# Patient Record
Sex: Male | Born: 1990 | Race: White | Hispanic: No | Marital: Single | Smoking: Never smoker
Health system: Southern US, Community
[De-identification: ages and names within clinical notes are randomized; demographics above are authoritative.]

## PROBLEM LIST (undated history)

## (undated) HISTORY — PX: APPENDECTOMY: SHX54

---
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
## 2008-11-17 ENCOUNTER — Inpatient Hospital Stay (HOSPITAL_COMMUNITY): Admission: EM | Admit: 2008-11-17 | Discharge: 2008-11-17 | Payer: Self-pay | Admitting: Emergency Medicine

## 2008-11-17 ENCOUNTER — Encounter (INDEPENDENT_AMBULATORY_CARE_PROVIDER_SITE_OTHER): Payer: Self-pay | Admitting: General Surgery

## 2010-07-01 IMAGING — CT CT ABDOMEN W/ CM
2 of 4 series · 15 of 46 positions shown, 17 images · IV contrast (Omnipaque 300)
Comparison: No priors.

CT ABDOMEN

CLINICAL DATA: Severe abdominal pain.  Intermittent right lower
quadrant pain for 2 weeks.

CT ABDOMEN AND PELVIS WITH CONTRAST
TECHNIQUE: Multidetector CT imaging of the abdomen and pelvis was
performed using the standard protocol following bolus
administration of intravenous contrast.
Contrast: 100 ml 5mnipaque-ERR IV and oral contrast media.

[Series 2: abd_pel_with 5.0 b40f · axial · 0.62mm/px · z∈[-430,-44]mm · 12 of 89 slices shown, 14 images]
[im 8/89  soft-tissue]
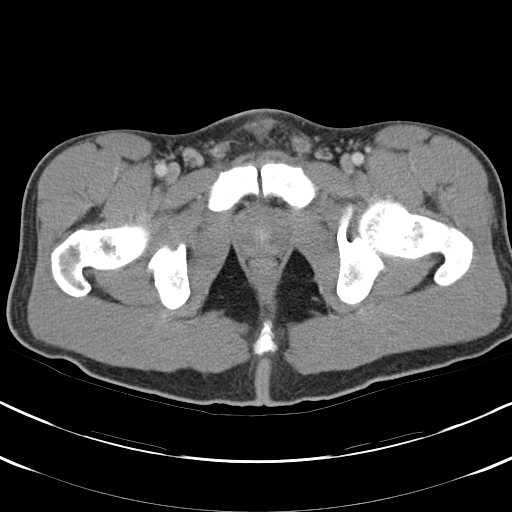
[im 8/89  bone]
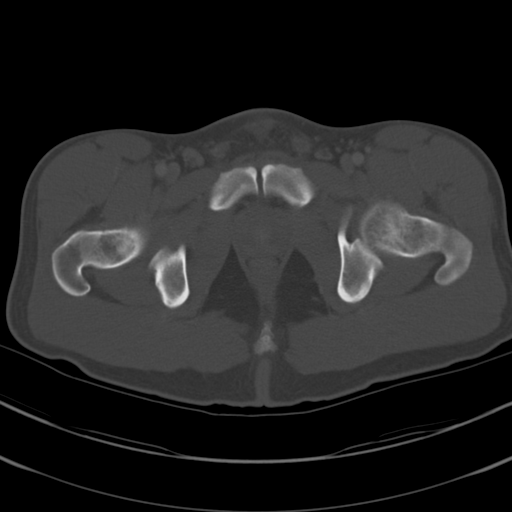
[im 15/89  soft-tissue]
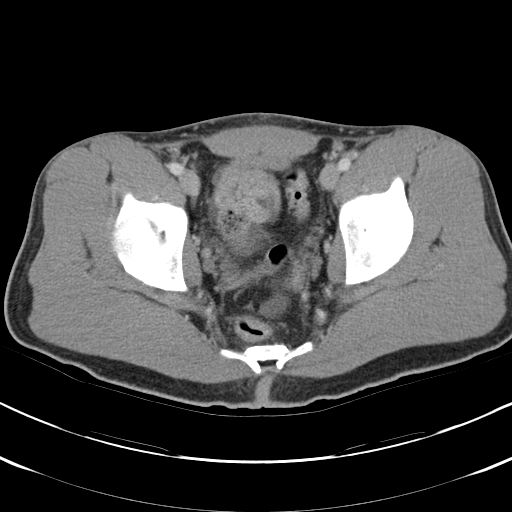
[im 22/89  soft-tissue]
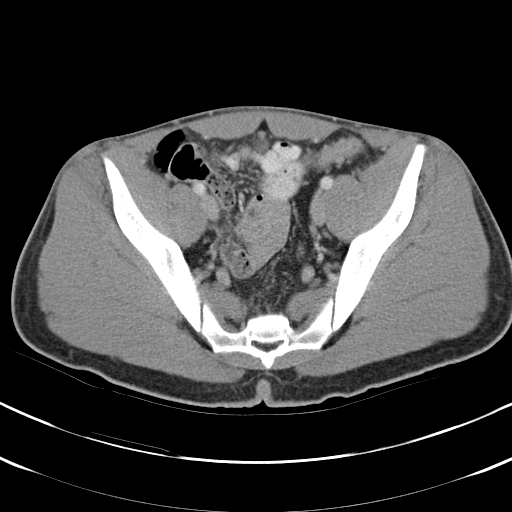
[im 29/89  soft-tissue]
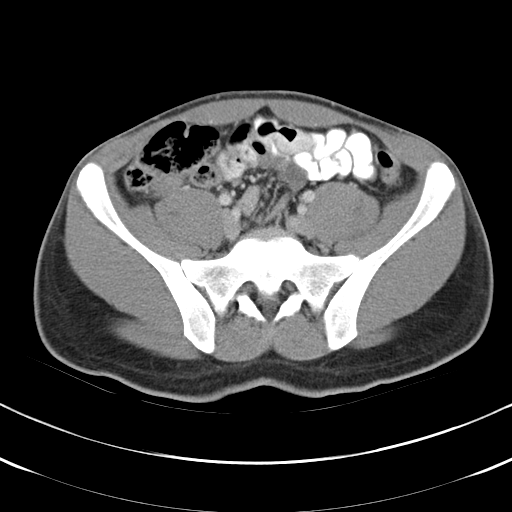
[im 36/89  soft-tissue]
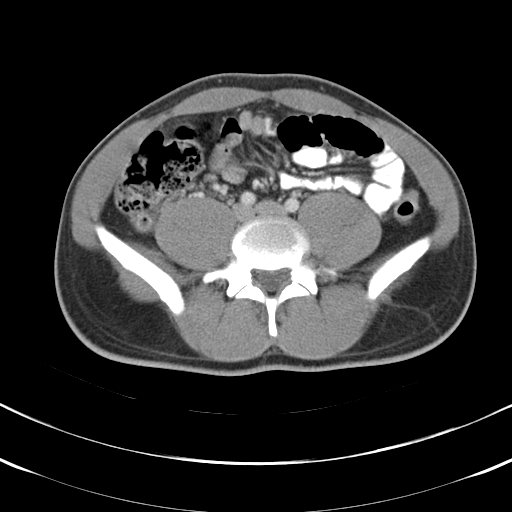
[im 43/89  soft-tissue]
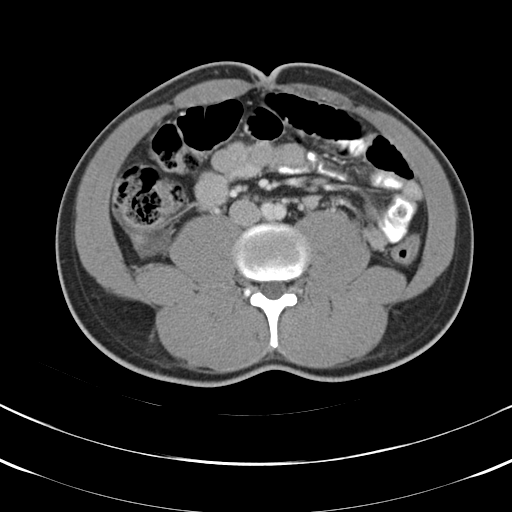
[im 50/89  soft-tissue]
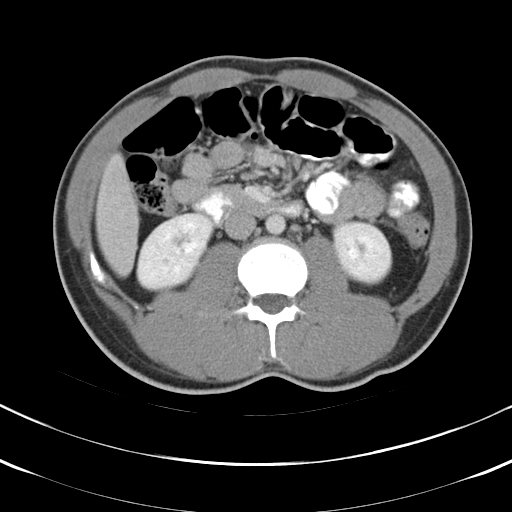
[im 57/89  soft-tissue]
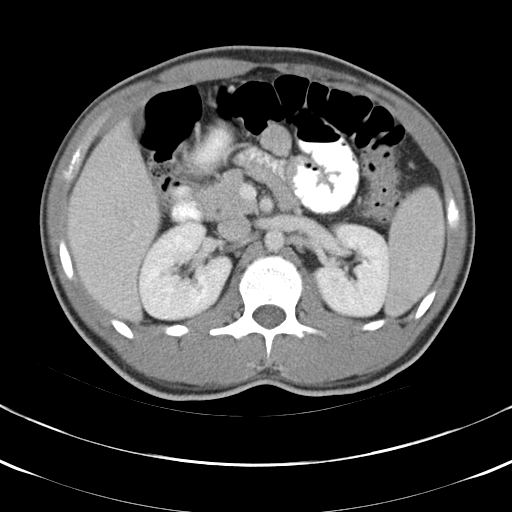
[im 64/89  soft-tissue]
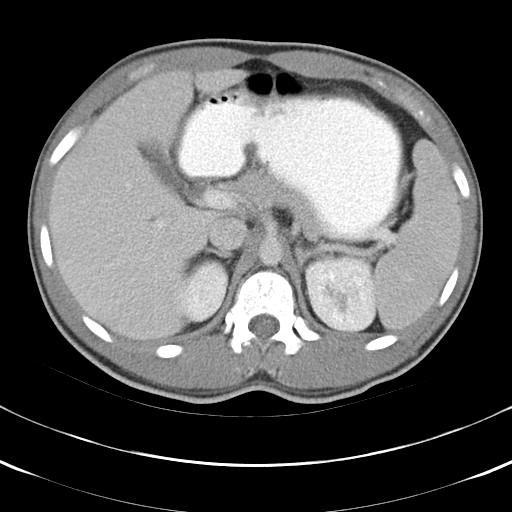
[im 64/89  bone]
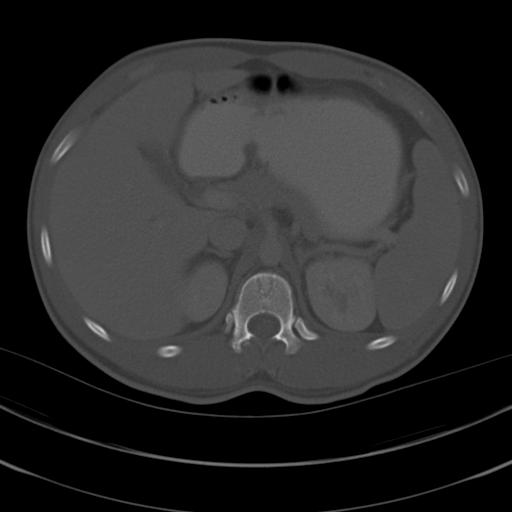
[im 71/89  soft-tissue]
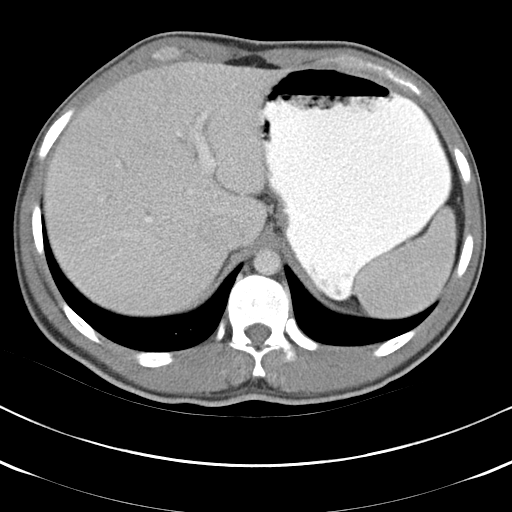
[im 78/89  soft-tissue]
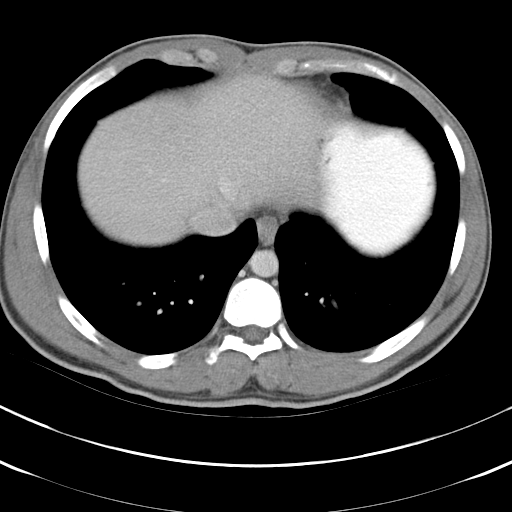
[im 85/89  soft-tissue]
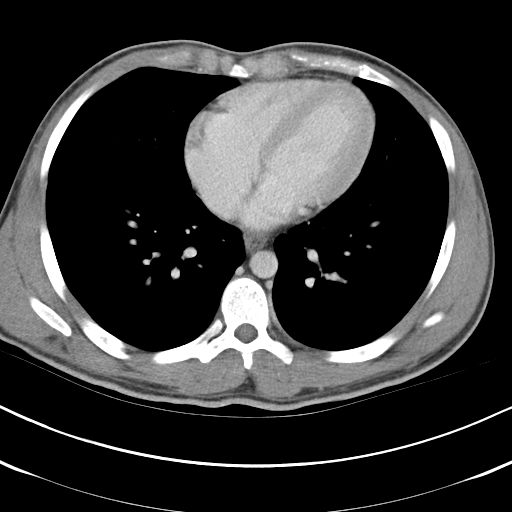

[Series 4: mpr cor post contrast (id) · coronal · 0.54mm/px · 3 of 72 slices shown]
[im 24/72  soft-tissue]
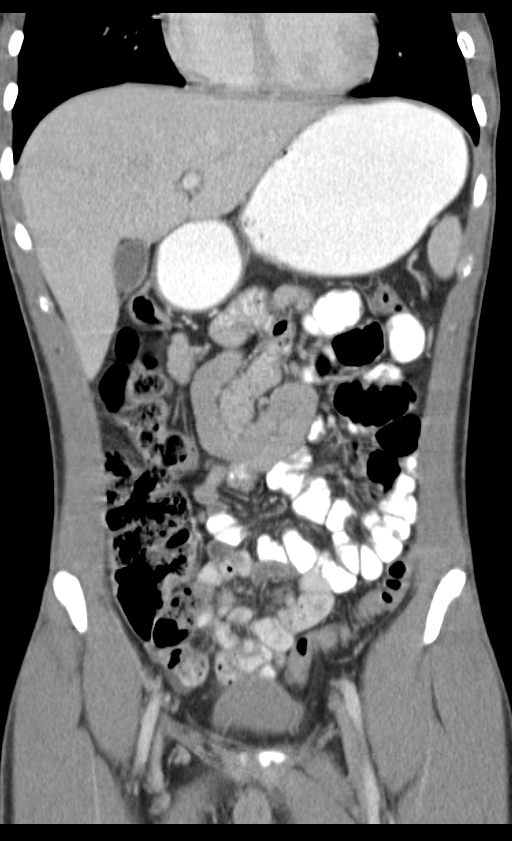
[im 32/72  soft-tissue]
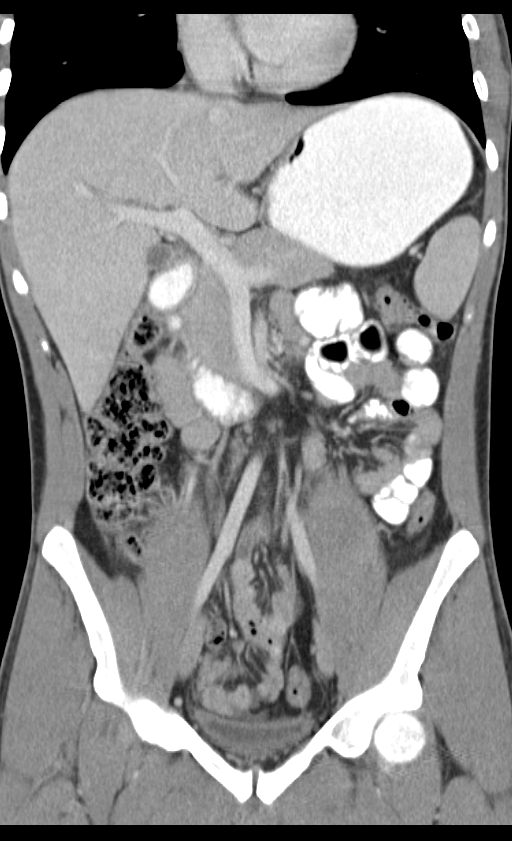
[im 40/72  soft-tissue]
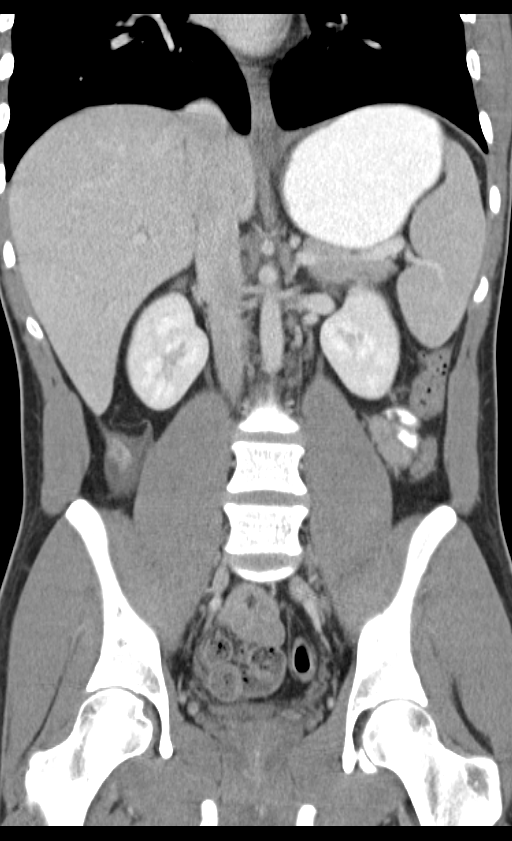

[15 of 46 positions shown; findings below may reference images not displayed]

FINDINGS: Normal liver, spleen, pancreas, kidneys, and adrenal
glands.  The appendix is dilated measuring 9 mm in diameter.  The
appendix wall is thickened.  There is a small amount of free fluid
surrounding the appendix.  Findings are consistent with acute
appendicitis.
IMPRESSION: CT findings consistent with appendicitis.

CT PELVIS
FINDINGS: No free pelvic fluid.  No primary acute pelvic
abnormality.  Findings consistent with acute appendicitis are again
noted.
IMPRESSION: CT findings consistent with acute appendicitis. Critical test
results telephoned to Dr.Yingying at the time of interpretation on
11/18/2007 at 9269 hours.

## 2010-11-30 LAB — URINALYSIS, ROUTINE W REFLEX MICROSCOPIC
Glucose, UA: NEGATIVE mg/dL
Ketones, ur: NEGATIVE mg/dL
pH: 6 (ref 5.0–8.0)

## 2010-11-30 LAB — COMPREHENSIVE METABOLIC PANEL
Alkaline Phosphatase: 28 U/L — ABNORMAL LOW (ref 39–117)
BUN: 9 mg/dL (ref 6–23)
CO2: 28 mEq/L (ref 19–32)
Chloride: 105 mEq/L (ref 96–112)
Glucose, Bld: 123 mg/dL — ABNORMAL HIGH (ref 70–99)
Potassium: 3.3 mEq/L — ABNORMAL LOW (ref 3.5–5.1)
Total Bilirubin: 0.7 mg/dL (ref 0.3–1.2)

## 2010-11-30 LAB — CBC
HCT: 43 % (ref 39.0–52.0)
Hemoglobin: 15 g/dL (ref 13.0–17.0)
RBC: 4.74 MIL/uL (ref 4.22–5.81)
WBC: 16 10*3/uL — ABNORMAL HIGH (ref 4.0–10.5)

## 2010-11-30 LAB — DIFFERENTIAL
Basophils Absolute: 0 10*3/uL (ref 0.0–0.1)
Basophils Relative: 0 % (ref 0–1)
Eosinophils Absolute: 0.2 10*3/uL (ref 0.0–0.7)
Neutro Abs: 11.8 10*3/uL — ABNORMAL HIGH (ref 1.7–7.7)
Neutrophils Relative %: 74 % (ref 43–77)

## 2011-01-02 NOTE — Op Note (Signed)
NAME:  Marcus Koch, Marcus Koch NO.:  [id number]   MEDICAL RECORD NO.:  [id number]          PATIENT TYPE:  INP   LOCATION:  A321                          FACILITY:  [facility]   PHYSICIAN:  Dalia Heading, M.D.  DATE OF BIRTH:  05-17-1991   DATE OF PROCEDURE:  11/17/2008  DATE OF DISCHARGE:  11/17/2008                               OPERATIVE REPORT   PREOPERATIVE DIAGNOSIS:  Acute appendicitis.   POSTOPERATIVE DIAGNOSIS:  Acute appendicitis.   PROCEDURE:  Laparoscopic appendectomy.   SURGEON:  Dalia Heading, MD   ANESTHESIA:  General endotracheal.   INDICATIONS:  The patient is a 20 year old white male presented to the  emergency room with worsening right lower quadrant abdominal pain.  CT  scan of the abdomen and pelvis reveals acute appendicitis without  evidence of perforation.  The risks and benefits of the procedure  including bleeding, infection, and the possibility of an open procedure  were fully explained to the patient, gave informed consent.   PROCEDURE NOTE:  The patient was placed in the supine position.  After  induction of general endotracheal anesthesia, the abdomen was prepped  and draped using the usual sterile technique with Betadine.  Surgical  site confirmation was performed.   A supraumbilical incision was made down to the fascia.  A Veress needle  was introduced into the abdominal cavity and confirmation of placement  was done using the saline drop test.  The abdomen was then insufflated  to 16 mmHg pressure.  An 11-mm trocar was introduced into the abdominal  cavity under direct visualization without difficulty.  The patient was  placed in deeper Trendelenburg position.  An additional 12-mm trocar was  placed in the suprapubic region and a 5-mm trocar was placed in the left  lower quadrant region.  The appendix was visualized and noted to be  retrocecal in nature and edematous and thickened.  There was no evidence  of perforation.  The  mesoappendix was divided using the Harmonic  Scalpel.  A standard Endo GIA was placed across the base of the appendix  and fired.  The appendix was removed using Endocatch bag without  difficulty.  The right lower quadrant was then inspected.  All fluid was  evacuated from the right lower quadrant.  A small piece of Surgicel was  placed into this region due to the retrocecal nature of the appendix and  the dissection.  The staple line was inspected and noted to be within  normal limits.  All fluid and air were then evacuated from the abdominal  cavity prior to removal of the trocars.   All wounds were irrigated with normal saline.  All wounds were injected  with 0.5% Sensorcaine.  The supraumbilical fascia as well as suprapubic  fascia reapproximated using 0 Vicryl interrupted sutures.  All skin  incisions were closed using staples.  Betadine ointment and dry sterile  dressings were applied.   All tape and needle counts were correct at the end of the procedure.  The patient was extubated in the operating room and went back to  recovery room  awake in stable condition.   COMPLICATIONS:  None.   SPECIMENS:  Appendix.   BLOOD LOSS:  Minimal.      Dalia Heading, M.D.  Electronically Signed     MAJ/MEDQ  D:  11/17/2008  T:  11/17/2008  Job:  [id number]

## 2015-12-21 ENCOUNTER — Encounter (HOSPITAL_COMMUNITY): Payer: Self-pay

## 2015-12-21 ENCOUNTER — Emergency Department (HOSPITAL_COMMUNITY)
Admission: EM | Admit: 2015-12-21 | Discharge: 2015-12-21 | Disposition: A | Discharge status: Other Acute Inpatient Hospital | Payer: BLUE CROSS/BLUE SHIELD | Attending: Emergency Medicine | Admitting: Emergency Medicine

## 2015-12-21 ENCOUNTER — Inpatient Hospital Stay (HOSPITAL_COMMUNITY)
Admission: EM | Admit: 2015-12-21 | Discharge: 2015-12-23 | DRG: 885 | Disposition: A | Discharge status: Home / Self Care | Payer: BLUE CROSS/BLUE SHIELD | Source: Intra-hospital | Attending: Psychiatry | Admitting: Psychiatry

## 2015-12-21 DIAGNOSIS — F10920 Alcohol use, unspecified with intoxication, uncomplicated: Secondary | ICD-10-CM

## 2015-12-21 DIAGNOSIS — Z880 Allergy status to penicillin: Secondary | ICD-10-CM | POA: Diagnosis not present

## 2015-12-21 DIAGNOSIS — F10120 Alcohol abuse with intoxication, uncomplicated: Secondary | ICD-10-CM | POA: Insufficient documentation

## 2015-12-21 DIAGNOSIS — G4700 Insomnia, unspecified: Secondary | ICD-10-CM | POA: Diagnosis present

## 2015-12-21 DIAGNOSIS — R45851 Suicidal ideations: Secondary | ICD-10-CM

## 2015-12-21 DIAGNOSIS — F329 Major depressive disorder, single episode, unspecified: Secondary | ICD-10-CM | POA: Diagnosis not present

## 2015-12-21 DIAGNOSIS — Z79899 Other long term (current) drug therapy: Secondary | ICD-10-CM | POA: Diagnosis not present

## 2015-12-21 DIAGNOSIS — F332 Major depressive disorder, recurrent severe without psychotic features: Secondary | ICD-10-CM | POA: Diagnosis present

## 2015-12-21 DIAGNOSIS — F410 Panic disorder [episodic paroxysmal anxiety] without agoraphobia: Secondary | ICD-10-CM | POA: Diagnosis present

## 2015-12-21 LAB — CBC
HCT: 47.3 % (ref 39.0–52.0)
HEMOGLOBIN: 16.8 g/dL (ref 13.0–17.0)
MCH: 31.2 pg (ref 26.0–34.0)
MCHC: 35.5 g/dL (ref 30.0–36.0)
MCV: 87.9 fL (ref 78.0–100.0)
PLATELETS: 321 10*3/uL (ref 150–400)
RBC: 5.38 MIL/uL (ref 4.22–5.81)
RDW: 12.7 % (ref 11.5–15.5)
WBC: 8.4 10*3/uL (ref 4.0–10.5)

## 2015-12-21 LAB — COMPREHENSIVE METABOLIC PANEL
ALT: 36 U/L (ref 17–63)
AST: 24 U/L (ref 15–41)
Albumin: 4.9 g/dL (ref 3.5–5.0)
Alkaline Phosphatase: 41 U/L (ref 38–126)
Anion gap: 13 (ref 5–15)
BUN: 7 mg/dL (ref 6–20)
CHLORIDE: 107 mmol/L (ref 101–111)
CO2: 21 mmol/L — AB (ref 22–32)
CREATININE: 0.78 mg/dL (ref 0.61–1.24)
Calcium: 9.4 mg/dL (ref 8.9–10.3)
GFR calc non Af Amer: >60 mL/min (ref >60)
Glucose, Bld: 113 mg/dL — ABNORMAL HIGH (ref 65–99)
POTASSIUM: 4 mmol/L (ref 3.5–5.1)
SODIUM: 141 mmol/L (ref 135–145)
Total Bilirubin: 0.4 mg/dL (ref 0.3–1.2)
Total Protein: 7.9 g/dL (ref 6.5–8.1)

## 2015-12-21 LAB — RAPID URINE DRUG SCREEN, HOSP PERFORMED
AMPHETAMINES: NOT DETECTED
Barbiturates: NOT DETECTED
Benzodiazepines: NOT DETECTED
Cocaine: NOT DETECTED
Opiates: NOT DETECTED
Tetrahydrocannabinol: NOT DETECTED

## 2015-12-21 LAB — SALICYLATE LEVEL

## 2015-12-21 LAB — ETHANOL: ALCOHOL ETHYL (B): 184 mg/dL — AB (ref <5)

## 2015-12-21 LAB — ACETAMINOPHEN LEVEL

## 2015-12-21 MED ORDER — MIRTAZAPINE 7.5 MG PO TABS
7.5000 mg | ORAL_TABLET | Freq: Every day | ORAL | Status: DC
  Administered 2015-12-21 – 2015-12-22 (×2): 7.5 mg via ORAL
  Filled 2015-12-21 (×5): qty 1

## 2015-12-21 MED ORDER — MAGNESIUM HYDROXIDE 400 MG/5ML PO SUSP: 30.0000 mL | Freq: Every day | ORAL | Status: DC | PRN

## 2015-12-21 MED ORDER — ACETAMINOPHEN 325 MG PO TABS: 650.0000 mg | ORAL_TABLET | Freq: Four times a day (QID) | ORAL | Status: DC | PRN

## 2015-12-21 MED ORDER — ALUM & MAG HYDROXIDE-SIMETH 200-200-20 MG/5ML PO SUSP: 30.0000 mL | ORAL | Status: DC | PRN

## 2015-12-21 MED ORDER — SERTRALINE HCL 50 MG PO TABS
50.0000 mg | ORAL_TABLET | Freq: Every day | ORAL | Status: DC
  Administered 2015-12-21 – 2015-12-23 (×3): 50 mg via ORAL
  Filled 2015-12-21 (×6): qty 1

## 2015-12-21 NOTE — ED Notes (Signed)
MD at bedside. 

## 2015-12-21 NOTE — BH Assessment (Cosign Needed)
Assessment completed. Consulted Donell SievertSpencer Simon, PA-C who recommended inpatient treatment. Pt has been assigned room 400 Bed 1 (Dr. Jama Flavorsobos).

## 2015-12-21 NOTE — BH Assessment (Addendum)
Tele Assessment Note   Marcus Koch is an 25 y.o. male presenting to [facility] with suicidal ideations with a plan to jump in front of a train. Pt stated "my fianc called the police and they brought me here". "We were standing outside talking about things and I mention that I wanted to catch a train". Pt did not report any previous suicide attempts but shared that he has thought about it for the past 6 years. Pt denied any self-injurious behaviors nor did he report a family history of suicide. Pt is currently receiving outpatient therapy as well as medication management. PT shared that he has been hospitalized once in the past due to having panic attacks. Pt denies HI and AVH at this time. Pt reported multiple stressors such as attending graduate school (1st generation to attend graduate school) and feeling pressure to provide for his fianc. Pt is reporting multiple depressive symptoms and shared that his sleep and appetite have been fair. Pt denied any illicit substance use but reported drinking beer and wine 1-2 times weekly. Pt did not report any physical, sexual or emotional abuse at this time.  Inpatient treatment is recommended for safety and stabilization. Pt has been assigned Room 401 Bed 1.   Diagnosis: Major Depressive Disorder, Recurrent episoder   Past Medical History: History reviewed. No pertinent past medical history.  No past surgical history on file.  Family History: History reviewed. No pertinent family history.  Social History:  has no tobacco, alcohol, and drug history on file.  Additional Social History:  Alcohol / Drug Use History of alcohol / drug use?: Yes Substance #1 Name of Substance 1: Alcohol  1 - Age of First Use: 16 1 - Amount (size/oz): "3 or 4 glasses of wine and a beer" 1 - Frequency: 1-2x weekly  1 - Duration: ongoing  1 - Last Use / Amount: 12-20-15  CIWA: CIWA-Ar BP: 127/85 mmHg Pulse Rate: 71 COWS:    PATIENT STRENGTHS: (choose at least two) Average or  above average intelligence Supportive family/friends  Allergies:  Allergies  Allergen Reactions  . Penicillins Other (See Comments)    Family history-- pt has never had  Has patient had a PCN reaction causing immediate rash, facial/tongue/throat swelling, SOB or lightheadedness with hypotension: n/a Has patient had a PCN reaction causing severe rash involving mucus membranes or skin necrosis: n/a Has patient had a PCN reaction that required hospitalization: n/a Has patient had a PCN reaction occurring within the last 10 years: n/a If all of the above answers are "NO", then may    Home Medications:  (Not in a hospital admission)  OB/GYN Status:  No LMP for male patient.  General Assessment Data Location of Assessment: [facility] ED TTS Assessment: In system Is this a Tele or Face-to-Face Assessment?: Face-to-Face Is this an Initial Assessment or a Re-assessment for this encounter?: Initial Assessment Marital status: Long term relationship Living Arrangements: Non-relatives/Friends Can pt return to current living arrangement?: Yes Admission Status: Voluntary Is patient capable of signing voluntary admission?: Yes Referral Source: Self/Family/Friend Insurance type: BCBS     Crisis Care Plan Living Arrangements: Non-relatives/Friends Name of Psychiatrist: [facility] Name of Therapist: [facility]  Education Status Is patient currently in school?: Yes Current Grade: IT consultant  Name of school: [employer]   Risk to self with the past 6 months Suicidal Ideation: Yes-Currently Present Has patient been a risk to self within the past 6 months prior to admission? : No Suicidal Intent: Yes-Currently Present Has patient had any  suicidal intent within the past 6 months prior to admission? : No Is patient at risk for suicide?: Yes Suicidal Plan?: Yes-Currently Present Has patient had any suicidal plan within the past 6 months prior to admission? : No Specify Current Suicidal Plan: jump in front of a  train  Access to Means: Yes Specify Access to Suicidal Means: trains  What has been your use of drugs/alcohol within the last 12 months?: Alcohol use reported.  Previous Attempts/Gestures: No How many times?: 0 Other Self Harm Risks: Pt denies Triggers for Past Attempts: None known (No previous attempts reported. ) Intentional Self Injurious Behavior: None Family Suicide History: No Recent stressful life event(s): Other (Comment), Financial Problems (Graduate school ) Persecutory voices/beliefs?: No Depression: Yes Depression Symptoms: Despondent, Insomnia, Tearfulness, Isolating, Guilt, Loss of interest in usual pleasures, Feeling worthless/self pity, Fatigue Substance abuse history and/or treatment for substance abuse?: Yes Suicide prevention information given to non-admitted patients: Not applicable  Risk to Others within the past 6 months Homicidal Ideation: No Does patient have any lifetime risk of violence toward others beyond the six months prior to admission? : No Thoughts of Harm to Others: No Current Homicidal Intent: No Current Homicidal Plan: No Access to Homicidal Means: No Identified Victim: None reported.  History of harm to others?: No Assessment of Violence: None Noted Violent Behavior Description: No violent behaviors reported.  Does patient have access to weapons?: No Criminal Charges Pending?: No Does patient have a court date: No Is patient on probation?: No  Psychosis Hallucinations: None noted Delusions: None noted  Mental Status Report Appearance/Hygiene: In scrubs Eye Contact: Fair Motor Activity: Freedom of movement Speech: Logical/coherent, Soft Level of Consciousness: Quiet/awake Mood: Depressed Affect: Appropriate to circumstance Anxiety Level: Moderate Thought Processes: Coherent, Relevant Judgement: Impaired Orientation: Person, Place, Time, Situation, Appropriate for developmental age Obsessive Compulsive Thoughts/Behaviors:  None  Cognitive Functioning Concentration: Decreased Memory: Recent Intact, Remote Intact IQ: Average Insight: Poor Impulse Control: Fair Appetite: Poor Sleep: Decreased Total Hours of Sleep: 5 Vegetative Symptoms: Staying in bed, Not bathing, Decreased grooming  ADLScreening [facility] Assessment Services) Patient's cognitive ability adequate to safely complete daily activities?: Yes Patient able to express need for assistance with ADLs?: Yes Independently performs ADLs?: Yes (appropriate for developmental age)  Prior Inpatient Therapy Prior Inpatient Therapy: Yes Prior Therapy Dates: 2011 Prior Therapy [facility]ilty/Provider(s):  [facility] unable to recall name[facility] Reason for Treatment: Anxiety/panic attacks  Prior Outpatient Therapy Prior Outpatient Therapy: Yes Prior Therapy Dates: Current  Prior Therapy Facilty/Provider(s): [facility]  Reason for Treatment: anxiety/ medication management  Does patient have an ACCT team?: No Does patient have Intensive In-House Services?  : No Does patient have [facility] services? : No Does patient have P4CC services?: No  ADL Screening (condition at time of admission) Patient's cognitive ability adequate to safely complete daily activities?: Yes Is the patient deaf or have difficulty hearing?: No Does the patient have difficulty seeing, even when wearing glasses/contacts?: No Does the patient have difficulty concentrating, remembering, or making decisions?: No Patient able to express need for assistance with ADLs?: Yes Does the patient have difficulty dressing or bathing?: No Independently performs ADLs?: Yes (appropriate for developmental age)       Abuse/Neglect Assessment (Assessment to be complete while patient is alone) Physical Abuse: Denies Verbal Abuse: Denies Sexual Abuse: Denies Exploitation of patient/patient's resources: Denies Self-Neglect: Denies     Merchant navy officerAdvance Directives (For Healthcare) Does patient have an  advance directive?: No Would patient like information  on creating an advanced directive?: No - patient declined information    Additional Information 1:1 In Past 12 Months?: No CIRT Risk: No Elopement Risk: No Does patient have medical clearance?: No     Disposition:  Disposition Initial Assessment Completed for this Encounter: Yes  Tawan Degroote S 12/21/2015 5:07 AM

## 2015-12-21 NOTE — [facility] Suicide Risk Assessment (Addendum)
[facility]   Nursing information obtained from:  Patient Demographic factors:  Male, Caucasian, Marcus Koch, lesbian, or bisexual orientation, Unemployed Current Mental Status:  Suicidal ideation indicated by patient Loss Factors:  Financial problems / change in socioeconomic status Historical Factors:  Family history of mental illness or substance abuse Risk Reduction Factors:  Sense of responsibility to family, Living with another person, especially a relative  Total Time spent with patient: 45 minutes Principal Problem:  Depression, consider Alcohol Abuse  Diagnosis:   Patient Active Problem List   Diagnosis Date Noted  . MDD (major depressive disorder), recurrent episode, severe (HCC) [F33.2] 12/21/2015    Continued Clinical Symptoms:  Alcohol Use Disorder Identification Test Final Score (AUDIT): 3 The "Alcohol Use Disorders Identification Test", Guidelines for Use in Primary Care, Second Edition.  World Science writerHealth Organization [facility](WHO). Score between 0-7:  no or low risk or alcohol related problems. Score between 8-15:  moderate risk of alcohol related problems. Score between 16-19:  high risk of alcohol related problems. Score 20 or above:  warrants further diagnostic evaluation for alcohol dependence and treatment.   CLINICAL FACTORS:  25 year old male, brought in by police as fiancee had reported concern patient might be suicidal , based on remarks patient had made . Reports depression, neuro-vegetative symptoms of depression , but denies any suicidal ideations. Reports worsening anxiety ,. Patient endorses drinking several times a week- denies heavy drinking, but admission BAL was 184.      Psychiatric Specialty Exam: ROS  Blood pressure 127/69, pulse 79, temperature 98.7 F (37.1 C), temperature source Oral, resp. rate 18, height 5\' 2"  (1.575 m), weight 153 lb (69.4 kg).Body mass index is 27.98 kg/(m^2).   see admit note MSE  COGNITIVE FEATURES THAT  CONTRIBUTE TO RISK:  Closed-mindedness and Loss of executive function    SUICIDE RISK:   Moderate:  Frequent suicidal ideation with limited intensity, and duration, some specificity in terms of plans, no associated intent, good self-control, limited dysphoria/symptomatology, some risk factors present, and identifiable protective factors, including available and accessible social support.  PLAN OF CARE: Patient will be admitted to inpatient psychiatric unit for stabilization and safety. Will provide and encourage milieu participation. Provide medication management and maked adjustments as needed.  Will follow daily.    I certify that inpatient services furnished can reasonably be expected to improve the patient's condition.   Marcus MassedOBOS, Marcus Birman, MD 12/21/2015, 3:39 PM

## 2015-12-21 NOTE — Progress Notes (Signed)
Patient ID: Marcus Koch, male   DOB: 10/21/1990, 25 y.o.   MRN: [id number] D: Client has visitor today "fiancee" reports depression "4" and anxiety "3" of 10. Denies SHI. A: Writer reviewed medication, administered as ordered. Staff will monitor q5015min for safety. R: client is safe on the unit, attended group.

## 2015-12-21 NOTE — ED Notes (Signed)
Pt presents with SI, plan to stand in front of a train.  Denies HI or AVH.  Feeling hopeless, AAO x 3, no distress noted, calm & cooperative.  Pt reports he has planned but never attempted SI.  Diagnosed with Anxiety, Panic Attacks and Depression in the past.  Pt reports he drinks 3-5 drinks once or twice a week.   Monitoring for safety, Q 15 min checks in effect.

## 2015-12-21 NOTE — ED Notes (Signed)
Pt BIB police. Pt called his significant other and said that he was waiting on a train. Pt had no train ticket and boyfriend thought that he was going to throw himself in front of it. Pt endorses thoughts of self harm. Pt A&Ox4. Pt appears intoxicated in triage.

## 2015-12-21 NOTE — H&P (Signed)
Psychiatric Admission Assessment Adult  Patient Identification: Marcus Koch MRN:  [id number] Date of Evaluation:  12/21/2015 Chief Complaint:   " I  have been struggling with anxiety this semester " Principal Diagnosis: MDD  Diagnosis:   Patient Active Problem List   Diagnosis Date Noted  . MDD (major depressive disorder), recurrent episode, severe ([place]) [F33.2] 12/21/2015   History of Present Illness::25 year old male, brought to ED by police as SO had concerns he was suicidal with thoughts of jumping in front of a train.  States " I really do not remember exactly what happened last night . " States " I was playing video games with my brother, and I vaguely remember walking outside" ( train tracks beside where he lives ) . Fiance came out and was very concerned , because " I was crying, upset ". Patient states that in the past he had made statement about having suicidal ideations, with thought of jumping into an oncoming train.  States he has been depressed recently, and describes some neuro-vegetative symptoms of depression  States he had not been having suicidal ideations recently, but does feel he has been overwhelmed, anxious, because of academic and financial stressors .  Patient reports episodic drinking, has been drinking 2-3 times a week as per his information . Patient was intoxicated upon admission to ED - BAL 184.   Associated Signs/Symptoms: Depression Symptoms:  depressed mood, anhedonia, insomnia, feelings of worthlessness/guilt, suicidal thoughts without plan, anxiety, loss of energy/fatigue, decreased appetite, weight stable  (Hypo) Manic Symptoms:  Denies  Anxiety Symptoms:  States he worries excessively, occasional episodes of panic, but not recently, denies agoraphobia, denies social phobia  Psychotic Symptoms:  Denies  PTSD Symptoms: Denies  Total Time spent with patient: 45 minutes  Past Psychiatric History: one prior psychiatric admission for anxiety, panic  attacks , while in the [employer] ( 6 years ago) . No history of suicide attempts, denies history of self cutting or self injurious ideations, denies impulsivity , denies violence .  Patient states he has had history of depression, but feels that his anxiety symptoms have been more severe than mood issues . Denies history of mania, hypomania or psychosis.    Is the patient at risk to self? Yes.    Has the patient been a risk to self in the past 6 months? No.  Has the patient been a risk to self within the distant past? No.  Is the patient a risk to others? No.  Has the patient been a risk to others in the past 6 months? No.  Has the patient been a risk to others within the distant past? No.   Prior Inpatient Therapy:  one prior psychiatric admission for anxiety 6 years ago Prior Outpatient Therapy:    Alcohol Screening: 1. How often do you have a drink containing alcohol?: 2 to 3 times a week 2. How many drinks containing alcohol do you have on a typical day when you are drinking?: 1 or 2 3. How often do you have six or more drinks on one occasion?: Never Preliminary Score: 0 9. Have you or someone else been injured as a result of your drinking?: No 10. Has a relative or friend or a doctor or another health worker been concerned about your drinking or suggested you cut down?: No Alcohol Use Disorder Identification Test Final Score (AUDIT): 3 Brief Intervention: AUDIT score less than 7 or less-screening does not suggest unhealthy drinking-brief intervention not indicated Substance Abuse History in  the last 12 months:  Denies drug abuse, states he has been drinking 2-3  Times, usually 3-4  drinks ( usually wine ) . Does not see himself as having an alcohol abuse issue at this time . Consequences of Substance Abuse: Remote history of blackouts , but not in many years , no history of DUI, no history of seizures  Previous Psychotropic Medications:  Has been on Celexa x 3 months. He states he is  unsure if it is helping , denies side effects.  Psychological Evaluations:  No  Past Medical History: denies medical illnesses Past Surgical History  Procedure Laterality Date  . Appendectomy     Family History: Mother died when patient was 27 years old , in [place]. Father alive, improved relationship, has 4 siblings  Family Psychiatric  History: mother was alcoholic, states that there is a strong history of alcohol or drug abuse on mother's side of the family . No suicides in family. States grandmother has history of bipolar disorder and anxiety.  Tobacco Screening: does not smoke  Social History: Single, no children , in graduate school ( Sociology), lives with fiance , brother- states good relationship with them, financial issues are related to not working at this time. Denies legal issues. History  Alcohol Use  . 2.4 oz/week  . 4 Glasses of wine per week     History  Drug Use No    Additional Social History:      Pain Medications: None Prescriptions: None Over the Counter: None History of alcohol / drug use?: Yes Longest period of sobriety (when/how long): No history of withdrawal Withdrawal Symptoms: Other (Comment) (None-only drinks 4 drinks per week) Name of Substance 1: Alcohol  1 - Age of First Use: 16 1 - Amount (size/oz): "3 or 4 glasses of wine and a beer" 1 - Frequency: weekly 1 - Duration: ongoing  1 - Last Use / Amount: 12-20-15  Allergies:   Allergies  Allergen Reactions  . Penicillins Other (See Comments)    Family history-- pt has never had  Has patient had a PCN reaction causing immediate rash, facial/tongue/throat swelling, SOB or lightheadedness with hypotension: n/a Has patient had a PCN reaction causing severe rash involving mucus membranes or skin necrosis: n/a Has patient had a PCN reaction that required hospitalization: n/a Has patient had a PCN reaction occurring within the last 10 years: n/a If all of the above answers are "NO", then may   Lab  Results:  Results for orders placed or performed during the hospital encounter of 12/21/15 (from the past 48 hour(s))  Comprehensive metabolic panel     Status: Abnormal   Collection Time: 12/21/15  1:22 AM  Result Value Ref Range   Sodium 141 135 - 145 mmol/L   Potassium 4.0 3.5 - 5.1 mmol/L   Chloride 107 101 - 111 mmol/L   CO2 21 (L) 22 - 32 mmol/L   Glucose, Bld 113 (H) 65 - 99 mg/dL   BUN 7 6 - 20 mg/dL   Creatinine, Ser 0.78 0.61 - 1.24 mg/dL   Calcium 9.4 8.9 - 10.3 mg/dL   Total Protein 7.9 6.5 - 8.1 g/dL   Albumin 4.9 3.5 - 5.0 g/dL   AST 24 15 - 41 U/L   ALT 36 17 - 63 U/L   Alkaline Phosphatase 41 38 - 126 U/L   Total Bilirubin 0.4 0.3 - 1.2 mg/dL   GFR calc non Af Amer >60 >60 mL/min   GFR calc Af Amer >  60 >60 mL/min    Comment: (NOTE) The eGFR has been calculated using the CKD EPI equation. This calculation has not been validated in all clinical situations. eGFR's persistently <60 mL/min signify possible Chronic Kidney Disease.    Anion gap 13 5 - 15  Ethanol     Status: Abnormal   Collection Time: 12/21/15  1:22 AM  Result Value Ref Range   Alcohol, Ethyl (B) 184 (H) <5 mg/dL    Comment:        LOWEST DETECTABLE LIMIT FOR SERUM ALCOHOL IS 5 mg/dL FOR MEDICAL PURPOSES ONLY   Salicylate level     Status: None   Collection Time: 12/21/15  1:22 AM  Result Value Ref Range   Salicylate Lvl <4.0 2.8 - 30.0 mg/dL  Acetaminophen level     Status: Abnormal   Collection Time: 12/21/15  1:22 AM  Result Value Ref Range   Acetaminophen (Tylenol), Serum <10 (L) 10 - 30 ug/mL    Comment:        THERAPEUTIC CONCENTRATIONS VARY SIGNIFICANTLY. A RANGE OF 10-30 ug/mL MAY BE AN EFFECTIVE CONCENTRATION FOR MANY PATIENTS. HOWEVER, SOME ARE BEST TREATED AT CONCENTRATIONS OUTSIDE THIS RANGE. ACETAMINOPHEN CONCENTRATIONS >150 ug/mL AT 4 HOURS AFTER INGESTION AND >50 ug/mL AT 12 HOURS AFTER INGESTION ARE OFTEN ASSOCIATED WITH TOXIC REACTIONS.   cbc     Status: None    Collection Time: 12/21/15  1:22 AM  Result Value Ref Range   WBC 8.4 4.0 - 10.5 K/uL   RBC 5.38 4.22 - 5.81 MIL/uL   Hemoglobin 16.8 13.0 - 17.0 g/dL   HCT 47.3 39.0 - 52.0 %   MCV 87.9 78.0 - 100.0 fL   MCH 31.2 26.0 - 34.0 pg   MCHC 35.5 30.0 - 36.0 g/dL   RDW 12.7 11.5 - 15.5 %   Platelets 321 150 - 400 K/uL  Rapid urine drug screen (hospital performed)     Status: None   Collection Time: 12/21/15  4:45 AM  Result Value Ref Range   Opiates NONE DETECTED NONE DETECTED   Cocaine NONE DETECTED NONE DETECTED   Benzodiazepines NONE DETECTED NONE DETECTED   Amphetamines NONE DETECTED NONE DETECTED   Tetrahydrocannabinol NONE DETECTED NONE DETECTED   Barbiturates NONE DETECTED NONE DETECTED    Comment:        DRUG SCREEN FOR MEDICAL PURPOSES ONLY.  IF CONFIRMATION IS NEEDED FOR ANY PURPOSE, NOTIFY LAB WITHIN 5 DAYS.        LOWEST DETECTABLE LIMITS FOR URINE DRUG SCREEN Drug Class       Cutoff (ng/mL) Amphetamine      1000 Barbiturate      200 Benzodiazepine   102 Tricyclics       725 Opiates          300 Cocaine          300 THC              50     Blood Alcohol level:  Lab Results  Component Value Date   [facility] 184* 36/64/4034    Metabolic Disorder Labs:  No results found for: HGBA1C, MPG No results found for: PROLACTIN No results found for: CHOL, TRIG, HDL, CHOLHDL, VLDL, LDLCALC  Current Medications: Current Facility-Administered Medications  Medication Dose Route Frequency Provider Last Rate Last Dose  . acetaminophen (TYLENOL) tablet 650 mg  650 mg Oral Q6H PRN Delfin Gant, NP      . alum & mag hydroxide-simeth (MAALOX/MYLANTA) 200-200-20 MG/5ML suspension  30 mL  30 mL Oral Q4H PRN Delfin Gant, NP      . magnesium hydroxide (MILK OF MAGNESIA) suspension 30 mL  30 mL Oral Daily PRN Delfin Gant, NP       PTA Medications: Prescriptions prior to admission  Medication Sig Dispense Refill Last Dose  . citalopram (CELEXA) 20 MG tablet Take 30 mg  by mouth daily.   12/20/2015 at Unknown time  . propranolol (INDERAL) 10 MG tablet Take 10 mg by mouth daily as needed (anxiety).   Past Week at Unknown time    Musculoskeletal: Strength & Muscle Tone: within normal limits Gait & Station: normal Patient leans: N/A  Psychiatric Specialty Exam: Physical Exam  Review of Systems  Constitutional: Negative.   Eyes: Negative.   Respiratory: Negative.   Cardiovascular: Negative.   Gastrointestinal: Negative.   Genitourinary: Negative.   Musculoskeletal: Negative.   Skin: Negative.   Neurological: Positive for headaches. Negative for seizures.  Endo/Heme/Allergies: Negative.   Psychiatric/Behavioral: Positive for depression and suicidal ideas. The patient is nervous/anxious.   All other systems reviewed and are negative.   Blood pressure 127/69, pulse 79, temperature 98.7 F (37.1 C), temperature source Oral, resp. rate 18, height '5\' 2"'$  (1.575 m), weight 153 lb (69.4 kg).Body mass index is 27.98 kg/(m^2).  General Appearance: Well Groomed  Engineer, water::  Fair  Speech:  Normal Rate  Volume:  Normal  Mood:  Depressed  Affect:  mildly constricted, anxious   Thought Process:  Linear  Orientation:  Full (Time, Place, and Person)  Thought Content:  denies hallucinations, no delusions   Suicidal Thoughts:  No at this time denies any suicidal ideations, and contracts for safety on the unit   Homicidal Thoughts:  No denies any homicidal or violent ideations   Memory:  recent and remote grossly intact   Judgement:  Fair  Insight:  Fair  Psychomotor Activity:  Normal  Concentration:  Good  Recall:  Good  Fund of Knowledge:Good  Language: Good  Akathisia:  No  Handed:  Right  AIMS (if indicated):     Assets:  Communication Skills Desire for Improvement Social Support  ADL's:  Intact  Cognition: WNL  Sleep:        Treatment Plan Summary: Daily contact with patient to assess and evaluate symptoms and progress in treatment,  Medication management, Plan inpatient admission and medications as below   Observation Level/Precautions:  15 minute checks  Laboratory:  as needed   Psychotherapy:  Milieu, support   Medications:  We discussed options, as he has been on Celexa for several weeks and feels it has not been working, agrees to another antidepressant trial Start ZOLOFT 50 mgrs QDAY for depression, anxiety Start REMERON 7.5 mgrs QHS for insomnia, depression  Consultations: as needed    Discharge Concerns:  -  Estimated LOS:- 5 days   Other:     I certify that inpatient services furnished can reasonably be expected to improve the patient's condition.    Neita Garnet, MD 5/3/20173:31 PM

## 2015-12-21 NOTE — ED Provider Notes (Signed)
CSN: [id number]     Arrival date & time 12/21/15  0042 History   First MD Initiated Contact with Patient 12/21/15 [phone]     Chief Complaint  Patient presents with  . Medical Clearance  . Suicidal     (Consider location/radiation/quality/duration/timing/severity/associated sxs/prior Treatment) HPI Comments: Patient is a 25 year old male with past medical history of depression. He presents for evaluation of suicidal ideation. He was brought in by authorities this evening after allegedly making suicidal threats to his fiance. He apparently made comments to his significant other about possibly jumping in front of a train. This person then called the authorities who in turn brought the patient here. When I asked the patient if he is suicidal, he is noncommittal in his response.  Patient is a 25 y.o. male presenting with mental health disorder. The history is provided by the patient.  Mental Health Problem Presenting symptoms: depression and suicidal thoughts   Degree of incapacity (severity):  Moderate Timing:  Intermittent Context: alcohol use   Relieved by:  Nothing Worsened by:  Nothing tried Ineffective treatments:  None tried   History reviewed. No pertinent past medical history. No past surgical history on file. History reviewed. No pertinent family history. Social History  Substance Use Topics  . Smoking status: None  . Smokeless tobacco: None  . Alcohol Use: None    Review of Systems  Psychiatric/Behavioral: Positive for suicidal ideas.  All other systems reviewed and are negative.     Allergies  Penicillins  Home Medications   Prior to Admission medications   Medication Sig Start Date End Date Taking? Authorizing Provider  citalopram (CELEXA) 20 MG tablet Take 30 mg by mouth daily.   Yes Historical Provider, MD  propranolol (INDERAL) 10 MG tablet Take 10 mg by mouth daily as needed (anxiety).   Yes Historical Provider, MD   BP 127/85 mmHg  Pulse 71  Temp(Src)  97.7 F (36.5 C) (Oral)  Resp 18  SpO2 98% Physical Exam  Constitutional: He is oriented to person, place, and time. He appears well-developed and well-nourished. No distress.  HENT:  Head: Normocephalic and atraumatic.  Mouth/Throat: Oropharynx is clear and moist.  Neck: Normal range of motion. Neck supple.  Cardiovascular: Normal rate and regular rhythm.  Exam reveals no friction rub.   No murmur heard. Pulmonary/Chest: Effort normal and breath sounds normal. No respiratory distress. He has no wheezes. He has no rales.  Abdominal: Soft. Bowel sounds are normal. He exhibits no distension. There is no tenderness.  Musculoskeletal: Normal range of motion. He exhibits no edema.  Neurological: He is alert and oriented to person, place, and time. Coordination normal.  Skin: Skin is warm and dry. He is not diaphoretic.  Psychiatric: His speech is normal and behavior is normal. Cognition and memory are normal. He expresses impulsivity. He exhibits a depressed mood. He expresses suicidal ideation.  Nursing note and vitals reviewed.   ED Course  Procedures (including critical care time) Labs Review Labs Reviewed  COMPREHENSIVE METABOLIC PANEL - Abnormal; Notable for the following:    CO2 21 (*)    Glucose, Bld 113 (*)    All other components within normal limits  ETHANOL - Abnormal; Notable for the following:    Alcohol, Ethyl (B) 184 (*)    All other components within normal limits  ACETAMINOPHEN LEVEL - Abnormal; Notable for the following:    Acetaminophen (Tylenol), Serum <10 (*)    All other components within normal limits  SALICYLATE LEVEL  CBC  URINE RAPID DRUG SCREEN, HOSP PERFORMED    Imaging Review No results found. I have personally reviewed and evaluated these images and lab results as part of my medical decision-making.   EKG Interpretation None      MDM   Final diagnoses:  None    Patient noncommittal about whether or not he is suicidal. He is intoxicated  but appears neurologically intact and appropriate. He will undergo consultation with TTS who will determine the final disposition.    Geoffery Lyons, MD 12/21/15 [phone]

## 2015-12-21 NOTE — BH Assessment (Signed)
[facility] Assessment Progress Note  Per Thedore MinsMojeed Akintayo, MD, this pt requires psychiatric hospitalization at this time.  Lillia AbedLindsay, RN, [facility]; please transport pt as close to 10:30 as possible.  Pt has reluctantly signed Voluntary Admission and Consent for Treatment, as well as Consent to Release Information to the [facility], and a notification call has been placed.  [facility].  Pt's nurse, Lincoln MaxinOlivette, has been notified, and agrees to send original paperwork along with pt via [facility] [place], and to call report to [phone].  Doylene Canninghomas Shruti Arrey, MA Triage Specialist [phone]

## 2015-12-21 NOTE — Progress Notes (Signed)
Pt A & O X 3. Denies SI, HI, AVH and pain when assessed. [facility]. Report called to Donovan KailPatrice W, RN.  D/C instructions reviewed with pt and understanding verbalized. All belongings in locker 39 returned to pt and belonging sheet signed in agreement with items received. Vitals WNL. Q 15 minutes checks maintained for safety without self harm gestures or incident to report at this time.

## 2015-12-21 NOTE — Tx Team (Addendum)
Initial Interdisciplinary Treatment Plan   PATIENT STRESSORS: Educational concerns Financial difficulties Occupational concerns   PATIENT STRENGTHS: Average or above average intelligence Capable of independent living Communication skills Physical Health   PROBLEM LIST: Problem List/Patient Goals Date to be addressed Date deferred Reason deferred Estimated date of resolution  Depression 12/21/15     Suicidal thoughts 12/21/15     "I just want to go home" 12/21/15     "I want to be able to fall asleep better" 12/21/15     Anxiety 12/21/15                              DISCHARGE CRITERIA:  Improved stabilization in mood, thinking, and/or behavior Verbal commitment to aftercare and medication compliance  PRELIMINARY DISCHARGE PLAN: Outpatient therapy Medication management  PATIENT/FAMIILY INVOLVEMENT: This treatment plan has been presented to and reviewed with the patient, Marcus Koch.  The patient and family have been given the opportunity to ask questions and make suggestions.  Marcus Koch 12/21/2015, 1:10 PM

## 2015-12-21 NOTE — Progress Notes (Signed)
Marcus Koch is a 25 year old male being admitted voluntarily to [facility] from [facility].  He came in with suicidal ideation and a plan to jump in front of train.  He has not history of suicide attempt but has had thoughts for the last 6 years.  He denies HI or A/V hallucinations.  He states that he has multiple stressors such as graduate school and feeling pressure to provide for his fiance.  He currently denies any SI but is able to to contract for safety if the thought will return.  He reports that he is hoping to be discharged by Friday because he is starting a new job.  Encouraged him to talk with the doctor about his concerns.  Admission paperwork completed and signed.  Belongings searched and secured in locker #50(NCID-cell phone ).  Skin assessment completed and noted tattoo on both shoulders and right upper chest as well as old appendectomy scar/well healed.  Q 15 minute checks initiated for safety.  We will monitor the progress towards his goals.

## 2015-12-22 DIAGNOSIS — F332 Major depressive disorder, recurrent severe without psychotic features: Secondary | ICD-10-CM | POA: Insufficient documentation

## 2015-12-22 NOTE — Progress Notes (Signed)
Adult Psychoeducational Group Note  Date:  12/22/2015 Time:  10:10 PM  Group Topic/Focus:  Wrap-Up Group:   The focus of this group is to help patients review their daily goal of treatment and discuss progress on daily workbooks.  Participation Level:  Active  Participation Quality:  Appropriate  Affect:  Appropriate  Cognitive:  Alert  Insight: Appropriate  Engagement in Group:  Engaged  Modes of Intervention:  Discussion  Additional Comments: Patient states, "I had a good day". Patient goal for today was "to socialize more".   Marcus Koch L Needham Biggins 12/22/2015, 10:10 PM

## 2015-12-22 NOTE — [facility] Group Notes (Signed)
[facility] LCSW Group Therapy 12/22/2015 1:15 PM Type of Therapy: Group Therapy Participation Level: Active  Participation Quality: Attentive, Sharing and Supportive  Affect: Blunted   Cognitive: Alert and Oriented  Insight: Developing/Improving and Engaged  Engagement in Therapy: Developing/Improving and Engaged  Modes of Intervention: Activity, Clarification, Confrontation, Discussion, Education, Exploration, Limit-setting, Orientation, Problem-solving, Rapport Building, Reality Testing, Socialization and Support  Summary of Progress/Problems: Patient was attentive and engaged with speaker from [employer]. Patient was attentive to speaker while they shared their story of dealing with mental health and overcoming it. Patient expressed interest in their programs and services and received information on their agency. Patient processed ways they can relate to the speaker.   Nassim Cosma, LCSW Clinical Social Worker [facility] [phone]   

## 2015-12-22 NOTE — [facility] Group Notes (Signed)
Late Entry from 12/21/15:   [facility] 12/21/15  1:15 PM Type of Therapy: Group Therapy Participation Level: Minimal  Participation Quality: Attentive  Affect: Appropriate  Cognitive: Alert and Oriented  Insight: Developing/Improving and Engaged  Engagement in Therapy: Developing/Improving and Engaged  Modes of Intervention: Clarification, Confrontation, Discussion, Education, Exploration, Limit-setting, Orientation, Problem-solving, Rapport Building, Dance movement psychotherapisteality Testing, Socialization and Support  Summary of Progress/Problems: The topic for group today was emotional regulation. This group focused on both positive and negative emotion identification and allowed group members to process ways to identify feelings, regulate negative emotions, and find healthy ways to manage internal/external emotions. Group members were asked to reflect on a time when their reaction to an emotion led to a negative outcome and explored how alternative responses using emotion regulation would have benefited them. Group members were also asked to discuss a time when emotion regulation was utilized when a negative emotion was experienced. Patient did not engage in discussion despite CSW encouragement.   Samuella BruinKristin Lakesia Dahle, MSW, LCSW Clinical Social Worker [facility] [phone]

## 2015-12-22 NOTE — Progress Notes (Signed)
[facility] Group Notes:  (Nursing/MHT/Case Management/Adjunct)  Date:  12/22/2015  Time:  1000  Type of Therapy:  Nurse Education  Participation Level:  Did Not Attend  Participation Quality:    Affect:    Cognitive:    Insight:    Engagement in Group:    Modes of Intervention:    Summary of Progress/Problems:  Beatrix ShipperWright, Yaeko Fazekas Martin 12/22/2015, 3:01 PM

## 2015-12-22 NOTE — Progress Notes (Signed)
[facility] MD Progress Note  12/22/2015 3:32 PM Marcus Koch  MRN:  [id number] Subjective:   Patient reports he is feeling " OK". He denies suicidal ideations . He denies symptoms of alcohol WDL.  He continues to ruminate about stressors, mainly related to academic and financial pressures. States he has been dealing with starting to work on his thesis and it has been challenging to find faculty member who agrees to chair it, and worries about financial difficulties . Objective : I have reviewed case with treatment team and have met with patient . Patient presents vaguely depressed, constricted, but states he is feeling "OK". Minimizes neuro-vegetative symptoms of depression at this time. As above, ruminative about significant stressors, but is future oriented, and states he has been considering withdrawing from school for  A semester in order to get a job to improve his financial issues and then return to complete grad school in 6 months or a year. We reviewed concerns about alcohol- patient acknowledges that recent events, suicidal ideations occurred in the context of alcohol intoxication and believes this would not have happened if he had been sober. We reviewed likely negative impact that binge drinking is having on his health, and negative effects on mood. Patient encouraged to consider alcohol abstinence . At this time denies medication side effects.  Principal Problem: MDD (major depressive disorder), recurrent episode, severe ([place]) Diagnosis:   Patient Active Problem List   Diagnosis Date Noted  . MDD (major depressive disorder), recurrent episode, severe ([place]) [F33.2] 12/21/2015   Total Time spent with patient: 25 minutes     Past Medical History: History reviewed. No pertinent past medical history.  Past Surgical History  Procedure Laterality Date  . Appendectomy     Family History: History reviewed. No pertinent family history.  Social History:  History  Alcohol Use  . 2.4 oz/week  .  4 Glasses of wine per week     History  Drug Use No    Social History   Social History  . Marital Status: Single    Spouse Name: N/A  . Number of Children: N/A  . Years of Education: N/A   Social History Main Topics  . Smoking status: Never Smoker   . Smokeless tobacco: None  . Alcohol Use: 2.4 oz/week    4 Glasses of wine per week  . Drug Use: No  . Sexual Activity: Yes    Birth Control/ Protection: Condom   Other Topics Concern  . None   Social History Narrative   Additional Social History:    Pain Medications: None Prescriptions: None Over the Counter: None History of alcohol / drug use?: Yes Longest period of sobriety (when/how long): No history of withdrawal Withdrawal Symptoms: Other (Comment) (None-only drinks 4 drinks per week) Name of Substance 1: Alcohol  1 - Age of First Use: 16 1 - Amount (size/oz): "3 or 4 glasses of wine and a beer" 1 - Frequency: weekly 1 - Duration: ongoing  1 - Last Use / Amount: 12-20-15  Sleep: improved   Appetite:  Good  Current Medications: Current Facility-Administered Medications  Medication Dose Route Frequency Provider Last Rate Last Dose  . acetaminophen (TYLENOL) tablet 650 mg  650 mg Oral Q6H PRN Delfin Gant, NP      . alum & mag hydroxide-simeth (MAALOX/MYLANTA) 200-200-20 MG/5ML suspension 30 mL  30 mL Oral Q4H PRN Delfin Gant, NP      . magnesium hydroxide (MILK OF MAGNESIA) suspension 30 mL  30  mL Oral Daily PRN Delfin Gant, NP      . mirtazapine (REMERON) tablet 7.5 mg  7.5 mg Oral QHS Myer Peer Cobos, MD   7.5 mg at 12/21/15 2152  . sertraline (ZOLOFT) tablet 50 mg  50 mg Oral Daily Jenne Campus, MD   50 mg at 12/22/15 9562    Lab Results:  Results for orders placed or performed during the hospital encounter of 12/21/15 (from the past 48 hour(s))  Comprehensive metabolic panel     Status: Abnormal   Collection Time: 12/21/15  1:22 AM  Result Value Ref Range   Sodium 141 135 - 145  mmol/L   Potassium 4.0 3.5 - 5.1 mmol/L   Chloride 107 101 - 111 mmol/L   CO2 21 (L) 22 - 32 mmol/L   Glucose, Bld 113 (H) 65 - 99 mg/dL   BUN 7 6 - 20 mg/dL   Creatinine, Ser 0.78 0.61 - 1.24 mg/dL   Calcium 9.4 8.9 - 10.3 mg/dL   Total Protein 7.9 6.5 - 8.1 g/dL   Albumin 4.9 3.5 - 5.0 g/dL   AST 24 15 - 41 U/L   ALT 36 17 - 63 U/L   Alkaline Phosphatase 41 38 - 126 U/L   Total Bilirubin 0.4 0.3 - 1.2 mg/dL   GFR calc non Af Amer >60 >60 mL/min   GFR calc Af Amer >60 >60 mL/min    Comment: (NOTE) The eGFR has been calculated using the CKD EPI equation. This calculation has not been validated in all clinical situations. eGFR's persistently <60 mL/min signify possible Chronic Kidney Disease.    Anion gap 13 5 - 15  Ethanol     Status: Abnormal   Collection Time: 12/21/15  1:22 AM  Result Value Ref Range   Alcohol, Ethyl (B) 184 (H) <5 mg/dL    Comment:        LOWEST DETECTABLE LIMIT FOR SERUM ALCOHOL IS 5 mg/dL FOR MEDICAL PURPOSES ONLY   Salicylate level     Status: None   Collection Time: 12/21/15  1:22 AM  Result Value Ref Range   Salicylate Lvl <1.3 2.8 - 30.0 mg/dL  Acetaminophen level     Status: Abnormal   Collection Time: 12/21/15  1:22 AM  Result Value Ref Range   Acetaminophen (Tylenol), Serum <10 (L) 10 - 30 ug/mL    Comment:        THERAPEUTIC CONCENTRATIONS VARY SIGNIFICANTLY. A RANGE OF 10-30 ug/mL MAY BE AN EFFECTIVE CONCENTRATION FOR MANY PATIENTS. HOWEVER, SOME ARE BEST TREATED AT CONCENTRATIONS OUTSIDE THIS RANGE. ACETAMINOPHEN CONCENTRATIONS >150 ug/mL AT 4 HOURS AFTER INGESTION AND >50 ug/mL AT 12 HOURS AFTER INGESTION ARE OFTEN ASSOCIATED WITH TOXIC REACTIONS.   cbc     Status: None   Collection Time: 12/21/15  1:22 AM  Result Value Ref Range   WBC 8.4 4.0 - 10.5 K/uL   RBC 5.38 4.22 - 5.81 MIL/uL   Hemoglobin 16.8 13.0 - 17.0 g/dL   HCT 47.3 39.0 - 52.0 %   MCV 87.9 78.0 - 100.0 fL   MCH 31.2 26.0 - 34.0 pg   MCHC 35.5 30.0 - 36.0  g/dL   RDW 12.7 11.5 - 15.5 %   Platelets 321 150 - 400 K/uL  Rapid urine drug screen (hospital performed)     Status: None   Collection Time: 12/21/15  4:45 AM  Result Value Ref Range   Opiates NONE DETECTED NONE DETECTED   Cocaine NONE DETECTED NONE  DETECTED   Benzodiazepines NONE DETECTED NONE DETECTED   Amphetamines NONE DETECTED NONE DETECTED   Tetrahydrocannabinol NONE DETECTED NONE DETECTED   Barbiturates NONE DETECTED NONE DETECTED    Comment:        DRUG SCREEN FOR MEDICAL PURPOSES ONLY.  IF CONFIRMATION IS NEEDED FOR ANY PURPOSE, NOTIFY LAB WITHIN 5 DAYS.        LOWEST DETECTABLE LIMITS FOR URINE DRUG SCREEN Drug Class       Cutoff (ng/mL) Amphetamine      1000 Barbiturate      200 Benzodiazepine   825 Tricyclics       053 Opiates          300 Cocaine          300 THC              50     Blood Alcohol level:  Lab Results  Component Value Date   [facility] 184* 12/21/2015    Physical Findings: AIMS: Facial and Oral Movements Muscles of Facial Expression: None, normal Lips and Perioral Area: None, normal Jaw: None, normal Tongue: None, normal,Extremity Movements Upper (arms, wrists, hands, fingers): None, normal Lower (legs, knees, ankles, toes): None, normal, Trunk Movements Neck, shoulders, hips: None, normal, Overall Severity Severity of abnormal movements (highest score from questions above): None, normal Incapacitation due to abnormal movements: None, normal Patient's awareness of abnormal movements (rate only patient's report): No Awareness, Dental Status Current problems with teeth and/or dentures?: No Does patient usually wear dentures?: No  CIWA:    COWS:     Musculoskeletal: Strength & Muscle Tone: within normal limits- no symptoms of ETOH withdrawal , no tremors, no diaphoresis, no restlessness  Gait & Station: normal Patient leans: N/A  Psychiatric Specialty Exam: ROS no nausea, no vomiting, no rash   Blood pressure 147/76, pulse 66,  temperature 98.1 F (36.7 C), temperature source Oral, resp. rate 16, height '5\' 2"'$  (1.575 m), weight 153 lb (69.4 kg).Body mass index is 27.98 kg/(m^2).  General Appearance: Fairly Groomed  Engineer, water::  Good  Speech:  Normal Rate  Volume:  Normal  Mood:  presents somewhat depressed   Affect:  constricted but reactive   Thought Process:  Linear  Orientation:  Full (Time, Place, and Person)  Thought Content:  ruminative about stressors,denies hallucinations, no delusions   Suicidal Thoughts:  No- at this time denies any suicidal ideations or any self injurious ideations, contracts for safety on the unit   Homicidal Thoughts:  No  Memory:  recent and remote grossly intact   Judgement:  Other:  improving   Insight:  improving   Psychomotor Activity:  Normal  Concentration:  Good  Recall:  Good  Fund of Knowledge:Good  Language: Good  Akathisia:  Negative  Handed:  Right  AIMS (if indicated):     Assets:  Desire for Improvement Resilience  ADL's:  Intact  Cognition: WNL  Sleep:     Assessment -patient reporting feeling better, but presents somewhat depressed, constricted. Denies any ongoing suicidal ideations . No alcohol withdrawal symptoms. Remains ruminative about financial and academic stressors. Gaining insight into the potential negative impact heavy drinking episodes have had on his mental health. At this time denies side effects, and states he slept better ( On Zoloft and Remeron trials) .  Treatment Plan Summary: Daily contact with patient to assess and evaluate symptoms and progress in treatment, Medication management, Plan inpatient treatment  and medications as below  Continue to encourage  Group  and milieu participation to work on Radiographer, therapeutic  Continue  Remeron 7.5 mgrs QHS for depression and insomnia Continue Zoloft 50 mgrs QDAY for depression and anxiety Treatment team working on disposition planning options  Neita Garnet, MD 12/22/2015, 3:32 PM

## 2015-12-22 NOTE — Progress Notes (Signed)
Patient ID: Marcus Koch, male   DOB: [birth year], 25 y.o.   MRN: [id number] D: Client visible on unit today interacting with peers, reports SO also visited. Client reports goal met today "I said I was going to get out of my room an socialize and I did" anxiety "1" of 10. Rated [facility] experience "9" of 10 "medication change good" "meeting people who have similar experiences"  "regroup myself, made plans to finish this semester of grad school, sit out this summer, work and then return to school" "I'm suppose to start a new job tomorrow" "as long as I'm in school I'll get therapy on campus, when I leave I'll seek free counseling or get on [employer] with his new job"  Client concerned about discharge doesn't want to miss out on new job. A: Writer provided emotional support encouraged client to move forward with positive plans, continue medications and therapy. Medication reviewed and administered as ordered. Staff will monitor q26mn for safety. R: Client is safe on the unit, attended group.

## 2015-12-22 NOTE — Tx Team (Signed)
Interdisciplinary Treatment Plan Update (Adult) Date: 12/22/2015    Time Reviewed: 9:30 AM  Progress in Treatment: Attending groups: Continuing to assess, patient new to milieu Participating in groups: Continuing to assess, patient new to milieu Taking medication as prescribed: Yes Tolerating medication: Yes Family/Significant other contact made: No, CSW assessing for appropriate contacts Patient understands diagnosis: Yes Discussing patient identified problems/goals with staff: Yes Medical problems stabilized or resolved: Yes Denies suicidal/homicidal ideation: Yes Issues/concerns per patient self-inventory: Yes Other:  New problem(s) identified: N/A  Discharge Plan or Barriers: CSW continuing to assess, patient new to milieu.  Reason for Continuation of Hospitalization:  Depression Anxiety Medication Stabilization   Comments: N/A  Estimated length of stay: 3-5 days    Patient is a 25 year old male who presented to the hospital with SI with plan to hang herself. Primary trigger(s) for admission was identified as financial, employment, and family stressors. Patient will benefit from crisis stabilization, medication evaluation, group therapy and psycho education in addition to case management for discharge planning. At discharge, it is recommended that Pt remain compliant with established discharge plan and continued treatment.   Review of initial/current patient goals per problem list:  1. Goal(s): Patient will participate in aftercare plan   Met: No   Target date: 3-5 days post admission date   As evidenced by: Patient will participate within aftercare plan AEB aftercare provider and housing plan at discharge being identified.  5/4: Goal not met: CSW assessing for appropriate referrals for pt and will have follow up secured prior to d/c.    2. Goal (s): Patient will exhibit decreased depressive symptoms and suicidal ideations.   Met: No   Target date: 3-5  days post admission date   As evidenced by: Patient will utilize self rating of depression at 3 or below and demonstrate decreased signs of depression or be deemed stable for discharge by MD.  5/3: Patient rates depression at 4.    3. Goal(s): Patient will demonstrate decreased signs and symptoms of anxiety.   Met: Yes   Target date: 3-5 days post admission date   As evidenced by: Patient will utilize self rating of anxiety at 3 or below and demonstrated decreased signs of anxiety, or be deemed stable for discharge by MD  5/3: Goal met. Patient rates anxiety at 3.   Attendees: Patient:    Family:    Physician: Dr. Parke Poisson 12/22/2015 9:30 AM  Nursing: Darrol Angel; Desma Paganini, RN 12/22/2015 9:30 AM  Clinical Social Worker: Tilden Fossa, LCSW 12/22/2015 9:30 AM  Other: Maxie Better, LCSW  12/22/2015 9:30 AM  Other:  12/22/2015 9:30 AM  Other: Lars Pinks, Case Manager 12/22/2015 9:30 AM  Other: Agustina Caroli; Samuel Jester, NP 12/22/2015 9:30 AM  Other:      Scribe for Treatment Team:  Tilden Fossa, [place]

## 2015-12-22 NOTE — Progress Notes (Signed)
D:Pt rates depression and anxiety as a 3 on 0-10 with 10 being the worst. He slept well last night and anxiety is decreasing. Pt has exams coming up to take at school. A:Offered support and 15 minute checks. Encouraged pt to work on Pharmacologistcoping skills for anxiety. R:Pt denies si and hi. Safety maintained on the unit.

## 2015-12-23 MED ORDER — MIRTAZAPINE 7.5 MG PO TABS: 7.5000 mg | ORAL_TABLET | Freq: Every day | ORAL | Status: AC

## 2015-12-23 MED ORDER — SERTRALINE HCL 50 MG PO TABS
50.0000 mg | ORAL_TABLET | Freq: Every day | ORAL | Status: AC
Start: 2015-12-23 — End: ?

## 2015-12-23 NOTE — Progress Notes (Signed)
  [facility] :  Will you be returning to the same living situation after discharge:  Yes,  patient plans to return home At discharge, do you have transportation home?: Yes,  patient reports access to transportation home Do you have the ability to pay for your medications: Yes, patient will be provided with prescriptions    Release of information consent forms completed and in the chart;  Patient's signature needed at discharge.  Patient to Follow up at: Follow-up Information    [facility].   Why:  Staff member will contact you within 2-3 business days with appointment information.    Contact information:   [address] [place], [place] [address] [phone]      Next level of care provider has access to [facility] Link:no  Safety Planning and Suicide Prevention discussed: Yes,  with patient  Have you used any form of tobacco in the last 30 days? (Cigarettes, Smokeless Tobacco, Cigars, and/or Pipes): No  Has patient been referred to the Quitline?: N/A patient is not a smoker  Patient has been referred for addiction treatment: Yes  Dorrien Grunder, [place]boKristin L 12/23/2015, 12:52 PM

## 2015-12-23 NOTE — [facility] Suicide Risk Assessment (Signed)
[facility]   Principal Problem: MDD (major depressive disorder), recurrent episode, severe ([facility]) Discharge Diagnoses:  Patient Active Problem List   Diagnosis Date Noted  . Severe episode of recurrent major depressive disorder, without psychotic features ([facility]) [F33.2]   . MDD (major depressive disorder), recurrent episode, severe ([facility]) [F33.2] 12/21/2015    Total Time spent with patient: 30 minutes  Musculoskeletal: Strength & Muscle Tone: within normal limits Gait & Station: normal Patient leans: N/A  Psychiatric Specialty Exam: ROS no headache, no chest pain, no shortness of breath, describes some loose stools , no vomiting  Blood pressure 117/72, pulse 59, temperature 97.8 F (36.6 C), temperature source Oral, resp. rate 18, height 5\' 2"  (1.575 m), weight 153 lb (69.4 kg).Body mass index is 27.98 kg/(m^2).  General Appearance: Well Groomed  Eye Contact::  Good  Speech:  Normal Rate409  Volume:  Normal  Mood:  improved and currently denies depression   Affect:  Appropriate and Full Range  Thought Process:  Linear  Orientation:  Full (Time, Place, and Person)  Thought Content:  denies hallucinations, no delusions   Suicidal Thoughts:  No- at this time denies any suicidal or self injurious ideations, denies any homicidal ideations   Homicidal Thoughts:  No  Memory:  recent and remote grossly intact   Judgement:  Other:  improved   Insight:  improving   Psychomotor Activity:  Normal  Concentration:  Good  Recall:  Good  Fund of Knowledge:Good  Language: Good  Akathisia:  Negative  Handed:  Right  AIMS (if indicated):     Assets:  Desire for Improvement Resilience Social Support  Sleep:  Number of Hours: 6.75  Cognition: WNL  ADL's:  Intact   Mental Status Per Nursing Assessment::   On Admission:  Suicidal ideation indicated by patient  Demographic Factors:  25 year old male, IT consultantGrad Student, 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 Denies prior history of suicide attempts   Risk Reduction Factors:   Living with another person, especially a relative and Positive coping skills or problem solving skills  Continued Clinical Symptoms:  At this time patient presents alert , attentive, well groomed, mood improved and currently euthymic, denies feeling depressed, affect full in range, no thought disorder, no SI or HI, no psychotic symptoms. Future oriented- states he has spoken with fiance and they have decided he is going to take some time off [employer], get a job, and focus more on himself and their relationship. We also discussed likely negative impact that alcohol consumption had on recent episode, and patient has reported frequent drinking ( 2-3 x per week) . Patient states he is planning to abstain or control his drinking . Denies medication side effects- as noted, loose stools reported, which may be related to Zoloft trial. We reviewed medication side effects, patient aware of Remeron potential for sedation and avoiding driving if sedated    Cognitive Features That Contribute To Risk:  No gross cognitive deficits noted upon discharge. Is alert , attentive, and oriented x 3    Suicide Risk:  Mild:  Suicidal ideation of limited frequency, intensity, duration, and specificity.  There are no identifiable plans, no associated intent, mild dysphoria and related symptoms, good self-control (both objective and subjective assessment), few other risk factors, and identifiable protective factors, including available and accessible social support.  Plan Of Care/Follow-up recommendations:  Activity:  as tolerated  Diet:  Regular  Tests:  NA Other:  See below  Patient requesting discharge and no current grounds for involuntary commitment  Patient is leaving unit in good spirits Plans to return  home  Plans to follow up at [facility], MD 12/23/2015, 12:22 PM

## 2015-12-23 NOTE — B[facility] Suicide Risk Assessment (Signed)
B[facility] INPATIENT:  Family/Significant Other Suicide Prevention Education  Suicide Prevention Education:  Patient Refusal for Family/Significant Other Suicide Prevention Education: The patient Marcus Koch has refused to provide written consent for family/significant other to be provided Family/Significant Other Suicide Prevention Education during admission and/or prior to discharge.  Physician notified. SPE reviewed with patient and brochure provided. Patient encouraged to return to hospital if having suicidal thoughts, patient verbalized his/her understanding and has no further questions at this time.    Makyla Bye, [place]ristin L 12/23/2015, 12:46 PM

## 2015-12-23 NOTE — [facility] Group Notes (Signed)
   [facility]  12/23/2015  8:45 AM   Participation Quality: Alert, Appropriate and Oriented  Mood/Affect: Blunted  Depression Rating: 1  Anxiety Rating: 2  Thoughts of Suicide: Pt denies SI/HI  Will you contract for safety? Yes  Current AVH: Pt denies  Plan for Discharge/Comments: Pt attended discharge planning group and actively participated in group. CSW provided pt with today's workbook. Patient plans to return home [facility].   Transportation Means: Pt reports access to transportation  Supports: No supports mentioned at this time  Samuella BruinKristin [place], MSW, Johnson & JohnsonLCSW Clinical Social Worker [facility] [phone]

## 2015-12-23 NOTE — BHH Counselor (Signed)
No PSA completed at this time as patient discharging prior to 72 hours.   Samuella BruinKristin Marnell Mcdaniel, LCSW Clinical Social Worker [facility] [phone]

## 2015-12-23 NOTE — Progress Notes (Signed)
D Ethelene Brownsnthony is UALon the 400 hall today and he tolerates this well. HE completed his daily assessment and on it he wrote he denied SI today and he rated his depression, hopelessness and anxiety " 0/1/1", respectively. He is given his MD Viona GilmoreSRA, MD transition record and AVS by this Clinical research associatewriter. These are reviewed with pt, he verbalizes understanding and he states willingness to comply. He is escorted to lobby and dc'd per MD order.

## 2015-12-23 NOTE — Progress Notes (Signed)
Recreation Therapy Notes  Date: 05.05.2017 Time: 9:30am Location: 300 Hall Dayroom   Group Topic: Stress Management  Goal Area(s) Addresses:  Patient will actively participate in stress management techniques presented during session.   Behavioral Response: Appropriate, Engaged   Intervention: Stress management techniques  Activity :  Guided Imagery. Patient listened to audio recording of guided imagery script for stress relief and increased self-esteem.   Education:  Stress Management, Discharge Planning.   Education Outcome: Acknowledges education  Clinical Observations/Feedback: Patient actively engaged in technique introduced, expressed no concerns and demonstrated ability to practice independently post d/c.   Marykay Lexenise L Lucas Winograd, LRT/CTRS        Amberrose Friebel L 12/23/2015 10:05 AM

## 2015-12-23 NOTE — Discharge Summary (Signed)
Physician Discharge Summary Note  Patient:  Marcus Koch is an 25 y.o., male MRN:  [id number] DOB:  20-Feb-1991 Patient phone:  [phone] (home)  Patient address:   [address] [place] [address],  Total Time spent with patient: 30 minutes  Date of Admission:  12/21/2015 Date of Discharge:  12/23/2015  Reason for Admission:  depression  Principal Problem: MDD (major depressive disorder), recurrent episode, severe [facility]) Discharge Diagnoses: Patient Active Problem List   Diagnosis Date Noted  . Severe episode of recurrent major depressive disorder, without psychotic features (HCC) [F33.2]   . MDD (major depressive disorder), recurrent episode, severe (HCC) [F33.2] 12/21/2015    Past Psychiatric History:  MDD  Past Medical History: History reviewed. No pertinent past medical history.  Past Surgical History  Procedure Laterality Date  . Appendectomy     Family History: History reviewed. No pertinent family history. Family Psychiatric  History:  Denied Social History:  History  Alcohol Use  . 2.4 oz/week  . 4 Glasses of wine per week     History  Drug Use No    Social History   Social History  . Marital Status: Single    Spouse Name: N/A  . Number of Children: N/A  . Years of Education: N/A   Social History Main Topics  . Smoking status: Never Smoker   . Smokeless tobacco: None  . Alcohol Use: 2.4 oz/week    4 Glasses of wine per week  . Drug Use: No  . Sexual Activity: Yes    Birth Control/ Protection: Condom   Other Topics Concern  . None   Social History Narrative    Hospital Course:   Marcus Koch was admitted for MDD (major depressive disorder), recurrent episode, severe (HCC) and crisis management.  He was treated with medications listed below.  Medical problems were identified and treated as needed.  Home medications were restarted as appropriate.  Improvement was monitored by observation and Marcus Koch daily report of symptom  reduction.  Emotional and mental status was monitored by daily self inventory reports completed by Marcus Koch and clinical staff.  Patient reported continued improvement, denied any new concerns.  Patient had been compliant on medications and denied side effects.  Support and encouragement was provided.    Patient did well during inpatient stay.  At time of discharge, patient rated both depression and anxiety levels to be manageable and minimal.  Patient was able to identify the triggers of emotional crises and de-stabilizations.  Patient identified the positive things in life that would help in dealing with feelings of loss, depression and unhealthy or abusive tendencies.         Marcus Koch was evaluated by the treatment team for stability and plans for continued recovery upon discharge.  He was offered further treatment options upon discharge including Residential, Intensive Outpatient and Outpatient treatment.  He will follow up with agencies listed below for medication management and counseling.  Encouraged patient to maintain satisfactory support network and home environment.  Advised to adhere to medication compliance and outpatient treatment follow up.      Marcus Koch motivation was an integral factor for scheduling further treatment.  Employment, transportation, bed availability, health status, family support, and any pending legal issues were also considered during his hospital stay.  Upon completion of this admission the patient was both mentally and medically stable for discharge denying suicidal/homicidal ideation, auditory/visual/tactile hallucinations, delusional thoughts and paranoia.      Physical Findings: AIMS:  Facial and Oral Movements Muscles of Facial Expression: None, normal Lips and Perioral Area: None, normal Jaw: None, normal Tongue: None, normal,Extremity Movements Upper (arms, wrists, hands, fingers): None, normal Lower (legs, knees, ankles, toes): None, normal, Trunk  Movements Neck, shoulders, hips: None, normal, Overall Severity Severity of abnormal movements (highest score from questions above): None, normal Incapacitation due to abnormal movements: None, normal Patient's awareness of abnormal movements (rate only patient's report): No Awareness, Dental Status Current problems with teeth and/or dentures?: No Does patient usually wear dentures?: No  CIWA:    COWS:     Musculoskeletal: Strength & Muscle Tone: within normal limits Gait & Station: normal Patient leans: N/A  Psychiatric Specialty Exam: Review of Systems  Psychiatric/Behavioral: Negative for suicidal ideas and hallucinations. Depression: improving.  All other systems reviewed and are negative.   Blood pressure 117/72, pulse 59, temperature 97.8 F (36.6 C), temperature source Oral, resp. rate 18, height 5\' 2"  (1.575 m), weight 69.4 kg (153 lb).Body mass index is 27.98 kg/(m^2).  Have you used any form of tobacco in the last 30 days? (Cigarettes, Smokeless Tobacco, Cigars, and/or Pipes): No  Has this patient used any form of tobacco in the last 30 days? (Cigarettes, Smokeless Tobacco, Cigars, and/or Pipes) Yes, N/A  Blood Alcohol level:  Lab Results  Component Value Date   [facility]    Metabolic Disorder Labs:  No results found for: HGBA1C, MPG No results found for: PROLACTIN No results found for: CHOL, TRIG, HDL, CHOLHDL, VLDL, LDLCALC  See Psychiatric Specialty Exam and Suicide Risk Assessment completed by Attending Physician prior to discharge.  Discharge destination:  Home  Is patient on multiple antipsychotic therapies at discharge:  No   Has Patient had three or more failed trials of antipsychotic monotherapy by history:  No  Recommended Plan for Multiple Antipsychotic Therapies: NA     Medication List    STOP taking these medications        citalopram 20 MG tablet  Commonly known as:  CELEXA     propranolol 10 MG tablet  Commonly known as:   INDERAL      TAKE these medications      Indication   mirtazapine 7.5 MG tablet  Commonly known as:  REMERON  Take 1 tablet (7.5 mg total) by mouth at bedtime.   Indication:  Trouble Sleeping     sertraline 50 MG tablet  Commonly known as:  ZOLOFT  Take 1 tablet (50 mg total) by mouth daily.   Indication:  Major Depressive Disorder       Follow-up Information    [facility]   Why:  Staff member will contact you within 2-3 business days with appointment information.    Contact information:   [address] [place], [place] [address] [phone]      Follow-up recommendations:  Activity:  as tol Diet:  as tol  Comments:  1.  Take all your medications as prescribed.   2.  Report any adverse side effects to outpatient provider. 3.  Patient instructed to not use alcohol or illegal drugs while on prescription medicines. 4.  In the event of worsening symptoms, instructed patient to call 911, the crisis hotline or go to nearest emergency room for evaluation of symptoms.  Signed: Lindwood QuaSheila May Agustin, NP [facility] 12/23/2015, 3:05 PM   Patient seen, Suicide Assessment Completed.  Disposition Plan Reviewed
